# Patient Record
Sex: Male | Born: 2000 | Race: White | Hispanic: No | Marital: Single | Smoking: Never smoker
Health system: Southern US, Community
[De-identification: ages and names within clinical notes are randomized; demographics above are authoritative.]

## PROBLEM LIST (undated history)

## (undated) DIAGNOSIS — F419 Anxiety disorder, unspecified: Secondary | ICD-10-CM

---
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
## 2019-03-14 ENCOUNTER — Encounter (HOSPITAL_BASED_OUTPATIENT_CLINIC_OR_DEPARTMENT_OTHER): Payer: Self-pay

## 2019-03-14 ENCOUNTER — Emergency Department (HOSPITAL_BASED_OUTPATIENT_CLINIC_OR_DEPARTMENT_OTHER)
Admission: EM | Admit: 2019-03-14 | Discharge: 2019-03-14 | Disposition: A | Discharge status: Home / Self Care | Payer: 59 | Attending: Emergency Medicine | Admitting: Emergency Medicine

## 2019-03-14 ENCOUNTER — Other Ambulatory Visit: Payer: Self-pay

## 2019-03-14 ENCOUNTER — Emergency Department (HOSPITAL_BASED_OUTPATIENT_CLINIC_OR_DEPARTMENT_OTHER): Payer: 59

## 2019-03-14 DIAGNOSIS — Y9389 Activity, other specified: Secondary | ICD-10-CM | POA: Diagnosis not present

## 2019-03-14 DIAGNOSIS — Y929 Unspecified place or not applicable: Secondary | ICD-10-CM | POA: Diagnosis not present

## 2019-03-14 DIAGNOSIS — F1210 Cannabis abuse, uncomplicated: Secondary | ICD-10-CM | POA: Diagnosis not present

## 2019-03-14 DIAGNOSIS — Y998 Other external cause status: Secondary | ICD-10-CM | POA: Diagnosis not present

## 2019-03-14 DIAGNOSIS — S0232XA Fracture of orbital floor, left side, initial encounter for closed fracture: Secondary | ICD-10-CM

## 2019-03-14 DIAGNOSIS — S01112A Laceration without foreign body of left eyelid and periocular area, initial encounter: Secondary | ICD-10-CM | POA: Insufficient documentation

## 2019-03-14 DIAGNOSIS — Z79899 Other long term (current) drug therapy: Secondary | ICD-10-CM | POA: Diagnosis not present

## 2019-03-14 DIAGNOSIS — S022XXA Fracture of nasal bones, initial encounter for closed fracture: Secondary | ICD-10-CM | POA: Diagnosis not present

## 2019-03-14 DIAGNOSIS — S0990XA Unspecified injury of head, initial encounter: Secondary | ICD-10-CM | POA: Diagnosis present

## 2019-03-14 HISTORY — DX: Anxiety disorder, unspecified: F41.9

## 2019-03-14 MED ORDER — HYDROCODONE-ACETAMINOPHEN 5-325 MG PO TABS: 1.0000 | ORAL_TABLET | Freq: Four times a day (QID) | ORAL | 0 refills | Status: AC | PRN

## 2019-03-14 MED ORDER — HYDROCODONE-ACETAMINOPHEN 5-325 MG PO TABS
1.0000 | ORAL_TABLET | Freq: Once | ORAL | Status: AC
  Administered 2019-03-14: 1 via ORAL
  Filled 2019-03-14: qty 1

## 2019-03-14 MED ORDER — CLINDAMYCIN HCL 300 MG PO CAPS: 300.0000 mg | ORAL_CAPSULE | Freq: Four times a day (QID) | ORAL | 0 refills | Status: AC

## 2019-03-14 MED ORDER — CLINDAMYCIN HCL 150 MG PO CAPS
300.0000 mg | ORAL_CAPSULE | Freq: Once | ORAL | Status: AC
  Administered 2019-03-14: 300 mg via ORAL
  Filled 2019-03-14: qty 2
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
## 2019-03-14 NOTE — Discharge Instructions (Signed)
Do not blow your nose.  Take antibiotics as prescribed.  You may take ibuprofen in addition to the Norco given.  Follow-up with the ear, nose and throat specialist.
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
## 2019-03-14 NOTE — ED Notes (Signed)
ED Provider at bedside. 
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
## 2019-03-14 NOTE — ED Notes (Signed)
Patient transported to CT 

## 2019-10-18 ENCOUNTER — Other Ambulatory Visit: Payer: Self-pay

## 2019-10-18 DIAGNOSIS — Z20822 Contact with and (suspected) exposure to covid-19: Secondary | ICD-10-CM

## 2019-10-19 LAB — NOVEL CORONAVIRUS, NAA: SARS-CoV-2, NAA: DETECTED — AB
